# Patient Record
Sex: Female | Born: 1995 | Race: Black or African American | Hispanic: No | Marital: Single | Smoking: Never smoker
Health system: Southern US, Community
[De-identification: ages and names within clinical notes are randomized; demographics above are authoritative.]

---

## 2018-07-28 ENCOUNTER — Other Ambulatory Visit: Payer: Self-pay

## 2018-07-28 ENCOUNTER — Emergency Department (HOSPITAL_COMMUNITY): Payer: BC Managed Care – PPO

## 2018-07-28 ENCOUNTER — Emergency Department (HOSPITAL_COMMUNITY)
Admission: EM | Admit: 2018-07-28 | Discharge: 2018-07-28 | Disposition: A | Discharge status: Home / Self Care | Payer: BC Managed Care – PPO | Attending: Emergency Medicine | Admitting: Emergency Medicine

## 2018-07-28 ENCOUNTER — Encounter (HOSPITAL_COMMUNITY): Payer: Self-pay | Admitting: Emergency Medicine

## 2018-07-28 DIAGNOSIS — Y999 Unspecified external cause status: Secondary | ICD-10-CM | POA: Insufficient documentation

## 2018-07-28 DIAGNOSIS — Y9389 Activity, other specified: Secondary | ICD-10-CM | POA: Insufficient documentation

## 2018-07-28 DIAGNOSIS — Y929 Unspecified place or not applicable: Secondary | ICD-10-CM | POA: Diagnosis not present

## 2018-07-28 DIAGNOSIS — S99922A Unspecified injury of left foot, initial encounter: Secondary | ICD-10-CM | POA: Diagnosis present

## 2018-07-28 DIAGNOSIS — S92125A Nondisplaced fracture of body of left talus, initial encounter for closed fracture: Secondary | ICD-10-CM | POA: Diagnosis not present

## 2018-07-28 MED ORDER — MELOXICAM 15 MG PO TABS: 15.0000 mg | ORAL_TABLET | Freq: Every day | ORAL | 0 refills | Status: AC
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
## 2018-07-28 NOTE — ED Notes (Signed)
Pt resting in bed. Family member present
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
## 2018-07-28 NOTE — ED Notes (Signed)
Pt in bed resting with ice applied to affected extremity. When asked about pain pt stated "its fine. Im good"

## 2018-07-28 NOTE — ED Notes (Signed)
Approached at nurses station by pts boyfriend asking if she was getting "something strong for pain". Advised that I was on my way to her room with a prescription and discharge instructions.

## 2018-07-28 NOTE — ED Notes (Signed)
Patient transported to X-ray 

## 2018-07-28 NOTE — ED Notes (Signed)
Spoke with pts mother on the phone per pt request, about pt not being prescribed narcotics for pain at home. Advised pt should take Mobic as prescribed as well as follow up with orthopedic specialist and take tylenol to aid with pain control. Spoke with PA Abigail regarding mothers concerns, who agrees narcotics are not necessary in this situation. PA Cammy Copa also spoke with the pt regarding this matter. Pt provided education on use of crutches, follow up instruction, rest, elevate and ice the affected extremity.

## 2018-07-28 NOTE — Discharge Instructions (Signed)
Get help right away if: You have severe pain. Your toes turn pale and cold or blue. You lose feeling in your toes (have numbness). You have pain, redness, warmth, and tenderness in the back of your leg. You have chest pain or difficulty breathing.
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
